# Patient Record
Sex: Male | Born: 2000 | Race: White | Hispanic: No | Marital: Single | Smoking: Never smoker
Health system: Southern US, Community
[De-identification: ages and names within clinical notes are randomized; demographics above are authoritative.]

## PROBLEM LIST (undated history)

## (undated) HISTORY — PX: TONSILLECTOMY: SUR1361

---
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
## 2021-04-28 DIAGNOSIS — S76011A Strain of muscle, fascia and tendon of right hip, initial encounter: Secondary | ICD-10-CM | POA: Diagnosis not present

## 2021-06-02 DIAGNOSIS — M546 Pain in thoracic spine: Secondary | ICD-10-CM | POA: Diagnosis not present

## 2021-06-02 DIAGNOSIS — M62830 Muscle spasm of back: Secondary | ICD-10-CM | POA: Diagnosis not present

## 2021-06-26 DIAGNOSIS — Z23 Encounter for immunization: Secondary | ICD-10-CM | POA: Diagnosis not present

## 2021-06-26 DIAGNOSIS — Z0000 Encounter for general adult medical examination without abnormal findings: Secondary | ICD-10-CM | POA: Diagnosis not present

## 2021-10-30 ENCOUNTER — Other Ambulatory Visit: Payer: Self-pay

## 2021-10-30 ENCOUNTER — Encounter (HOSPITAL_COMMUNITY): Payer: Self-pay | Admitting: Emergency Medicine

## 2021-10-30 ENCOUNTER — Emergency Department (HOSPITAL_COMMUNITY)
Admission: EM | Admit: 2021-10-30 | Discharge: 2021-10-31 | Disposition: A | Discharge status: Home / Self Care | Payer: BC Managed Care – PPO | Attending: Student | Admitting: Student

## 2021-10-30 DIAGNOSIS — N451 Epididymitis: Secondary | ICD-10-CM | POA: Diagnosis not present

## 2021-10-30 DIAGNOSIS — N433 Hydrocele, unspecified: Secondary | ICD-10-CM | POA: Diagnosis not present

## 2021-10-30 DIAGNOSIS — N50812 Left testicular pain: Secondary | ICD-10-CM | POA: Diagnosis not present

## 2021-10-30 DIAGNOSIS — N503 Cyst of epididymis: Secondary | ICD-10-CM | POA: Diagnosis not present

## 2021-10-30 NOTE — ED Provider Triage Note (Signed)
Emergency Medicine Provider Triage Evaluation Note ? ?James [name] , a 21 y.o. male  was evaluated in triage.  Pt complains of left testicular pain onset this morning. Has not tried medications for symptoms.  Denies dysuria, hematuria, penile pain/swelling, testicular swelling, lesions.  Denies concern for STI at this time. ? ?Review of Systems  ?Positive: As per HPI above ?Negative: As per HPI above ? ?Physical Exam  ?BP 124/65   Pulse 80   Temp 99.2 ?F (37.3 ?C) (Oral)   Resp 16   Ht 5\' 8"  (1.727 m)   Wt 62 kg   SpO2 100%   BMI 20.78 kg/m?  ?Gen:   Awake, no distress   ?Resp:  Normal effort  ?MSK:   Moves extremities without difficulty  ?Other:  RN chaperone present for exam.  No lesions noted to genital area.  Testicles within normal lie. Cremasteric reflex intact bilaterally.  ? ?Medical Decision Making  ?Medically screening exam initiated at 11:42 PM.  Appropriate orders placed.  Chidiebere Wynn was informed that the remainder of the evaluation will be completed by another provider, this initial triage assessment does not replace that evaluation, and the importance of remaining in the ED until their evaluation is complete. ?  ?Anissa Abbs A, PA-C ?10/31/21 0000 ? ?

## 2021-10-30 NOTE — ED Triage Notes (Signed)
Patient reports left testicular pain onset this morning , denies injury , no hematuria or dysuria .  ?

## 2021-10-31 ENCOUNTER — Emergency Department (HOSPITAL_COMMUNITY): Payer: BC Managed Care – PPO

## 2021-10-31 DIAGNOSIS — N433 Hydrocele, unspecified: Secondary | ICD-10-CM | POA: Diagnosis not present

## 2021-10-31 DIAGNOSIS — N503 Cyst of epididymis: Secondary | ICD-10-CM | POA: Diagnosis not present

## 2021-10-31 LAB — CBC WITH DIFFERENTIAL/PLATELET
Abs Immature Granulocytes: 0.03 10*3/uL (ref 0.00–0.07)
Basophils Absolute: 0.1 10*3/uL (ref 0.0–0.1)
Basophils Relative: 1 %
Eosinophils Absolute: 0.1 10*3/uL (ref 0.0–0.5)
Eosinophils Relative: 2 %
HCT: 43.1 % (ref 39.0–52.0)
Hemoglobin: 15.3 g/dL (ref 13.0–17.0)
Immature Granulocytes: 1 %
Lymphocytes Relative: 36 %
Lymphs Abs: 2.2 10*3/uL (ref 0.7–4.0)
MCH: 32.6 pg (ref 26.0–34.0)
MCHC: 35.5 g/dL (ref 30.0–36.0)
MCV: 91.9 fL (ref 80.0–100.0)
Monocytes Absolute: 0.6 10*3/uL (ref 0.1–1.0)
Monocytes Relative: 9 %
Neutro Abs: 3.1 10*3/uL (ref 1.7–7.7)
Neutrophils Relative %: 51 %
Platelets: 193 10*3/uL (ref 150–400)
RBC: 4.69 MIL/uL (ref 4.22–5.81)
RDW: 11.9 % (ref 11.5–15.5)
WBC: 6.1 10*3/uL (ref 4.0–10.5)
nRBC: 0 % (ref 0.0–0.2)

## 2021-10-31 LAB — BASIC METABOLIC PANEL
Anion gap: 8 (ref 5–15)
BUN: 16 mg/dL (ref 6–20)
CO2: 24 mmol/L (ref 22–32)
Calcium: 10.2 mg/dL (ref 8.9–10.3)
Chloride: 106 mmol/L (ref 98–111)
Creatinine, Ser: 0.83 mg/dL (ref 0.61–1.24)
GFR, Estimated: >60 mL/min (ref >60)
Glucose, Bld: 96 mg/dL (ref 70–99)
Potassium: 3.9 mmol/L (ref 3.5–5.1)
Sodium: 138 mmol/L (ref 135–145)

## 2021-10-31 LAB — URINALYSIS, ROUTINE W REFLEX MICROSCOPIC
Bilirubin Urine: NEGATIVE
Glucose, UA: NEGATIVE mg/dL
Hgb urine dipstick: NEGATIVE
Ketones, ur: NEGATIVE mg/dL
Leukocytes,Ua: NEGATIVE
Nitrite: NEGATIVE
Protein, ur: NEGATIVE mg/dL
Specific Gravity, Urine: 1.002 — ABNORMAL LOW (ref 1.005–1.030)
pH: 6 (ref 5.0–8.0)

## 2021-10-31 MED ORDER — NAPROXEN 375 MG PO TABS: 375.0000 mg | ORAL_TABLET | Freq: Two times a day (BID) | ORAL | 0 refills | Status: DC

## 2021-10-31 NOTE — ED Provider Notes (Signed)
?[facility] EMERGENCY DEPARTMENT ?Provider Note ? ?CSN: [id number] ?Arrival date & time: 10/30/21 2238 ? ?Chief Complaint(s) ?Testicular Pain ( Left)  ? ?HPI ?James [name] is a 21 y.o. male who presents emergency department for evaluation of testicular pain.  Patient states that he had gradual onset left-sided testicular pain that started this morning.  Denies dysuria, hematuria, penile discharge, penile rash, high risk behavior.  Denies chest pain, shortness of breath, abdominal pain, nausea, vomiting or other systemic symptoms.  He states the symptoms peaked causing him to come to the emergency department, but since he has been waiting for 5 and half hours in the ER waiting room, his symptoms have since resolved. ? ?HPI ? ?Past Medical History ?History reviewed. No pertinent past medical history. ?There are no problems to display for this patient. ? ?Home Medication(s) ?Prior to Admission medications   ?Medication Sig Start Date End Date Taking? Authorizing Provider  ?naproxen (NAPROSYN) 375 MG tablet Take 1 tablet (375 mg total) by mouth 2 (two) times daily. 10/31/21  Yes Serai Tukes, Wyn Forster, MD  ?                                                                                                                                  ?Past Surgical History ?Past Surgical History:  ?Procedure Laterality Date  ? TONSILLECTOMY    ? ?Family History ?No family history on file. ? ?Social History ?Social History  ? ?Tobacco Use  ? Smoking status: Never  ?Substance Use Topics  ? Alcohol use: Yes  ? Drug use: Yes  ?  Types: Marijuana  ? ?Allergies ?Patient has no known allergies. ? ?Review of Systems ?Review of Systems  ?Genitourinary:  Positive for testicular pain.  ? ?Physical Exam ?Vital Signs  ?I have reviewed the triage vital signs ?BP 107/63   Pulse 94   Temp 99.2 ?F (37.3 ?C) (Oral)   Resp 16   Ht 5\' 8"  (1.727 m)   Wt 62 kg   SpO2 100%   BMI 20.78 kg/m?  ? ?Physical Exam ?Vitals and nursing note  reviewed.  ?Constitutional:   ?   General: He is not in acute distress. ?   Appearance: He is well-developed.  ?HENT:  ?   Head: Normocephalic and atraumatic.  ?Eyes:  ?   Conjunctiva/sclera: Conjunctivae normal.  ?Cardiovascular:  ?   Rate and Rhythm: Normal rate and regular rhythm.  ?   Heart sounds: No murmur heard. ?Pulmonary:  ?   Effort: Pulmonary effort is normal. No respiratory distress.  ?   Breath sounds: Normal breath sounds.  ?Abdominal:  ?   Palpations: Abdomen is soft.  ?   Tenderness: There is no abdominal tenderness.  ?Genitourinary: ?   Penis: Normal.   ?   Testes: Normal.  ?Musculoskeletal:     ?   General: No swelling.  ?   Cervical back: Neck supple.  ?Skin: ?  General: Skin is warm and dry.  ?   Capillary Refill: Capillary refill takes less than 2 seconds.  ?Neurological:  ?   Mental Status: He is alert.  ?Psychiatric:     ?   Mood and Affect: Mood normal.  ? ? ?ED Results and Treatments ?Labs ?(all labs ordered are listed, but only abnormal results are displayed) ?Labs Reviewed  ?URINALYSIS, ROUTINE W REFLEX MICROSCOPIC - Abnormal; Notable for the following components:  ?    Result Value  ? Color, Urine COLORLESS (*)   ? Specific Gravity, Urine 1.002 (*)   ? All other components within normal limits  ?BASIC METABOLIC PANEL  ?CBC WITH DIFFERENTIAL/PLATELET  ?                                                                                                                       ? ?Radiology ?US SCROTUM W/DOPPLER ? ?Result Date: 10/31/2021 ?CLINICAL DATA:  Testicular pain. EXAM: SCROTAL ULTRASOUND DOPPLER ULTRASOUND OF THE TESTICLES TECHNIQUE: Complete ultrasound examination of the testicles, epididymis, and other scrotal structures was performed. Color and spectral Doppler ultrasound were also utilized to evaluate blood flow to the testicles. COMPARISON:  None. FINDINGS: Right testicle Measurements: 5.1 cm x 2.4 cm x 3.3 cm. No mass is visualized. A 0.3 cm x 0.2 cm x 0.3 cm RIGHT scrotal pearl is  seen. Left testicle Measurements: 4.6 cm x 2.3 cm x 3.5 cm. No mass is visualized. A 0.5 cm x 0.2 cm x 0.4 cm LEFT scrotal pearl is seen. Right epididymis: A 0.6 cm x 0.5 cm x 0.5 cm right epididymal head cyst is seen. Left epididymis:  Normal in size and appearance. Hydrocele: Small bilateral hydroceles are seen, left greater than right. Varicocele:  None visualized. Pulsed Doppler interrogation of both testes demonstrates normal low resistance arterial and venous waveforms bilaterally. IMPRESSION: 1. Small, bilateral scrotal pearls. 2. Small bilateral hydroceles, left greater than right. 3. Subcentimeter right epididymal head cyst. Electronically Signed   By: Aram Candela M.D.   On: 10/31/2021 00:46   ? ?Pertinent labs & imaging results that were available during my care of the patient were reviewed by me and considered in my medical decision making (see MDM for details). ? ?Medications Ordered in ED ?Medications - No data to display                                                               ?                                                                    ?  Procedures ?Procedures ? ?(including critical care time) ? ?Medical Decision Making / ED Course ? ? ?This patient presents to the ED for concern of testicular pain, this involves an extensive number of treatment options, and is a complaint that carries with it a high risk of complications and morbidity.  The differential diagnosis includes epididymitis, orchitis, testicular torsion, cystitis ? ?MDM: ?Patient seen the emergency department for evaluation of testicular pain.  Physical exam unremarkable with no tender tenderness to testicular elevation, cremasteric reflex intact.  Ultrasound with small bilateral hydroceles, epididymal cysts, bilateral scrotal pearls.  UA unremarkable, laboratory evaluation unremarkable.  Patient presentation consistent with epididymitis and he was discharged with a prescription for Naprosyn and resources to contact  urologist if symptoms return. ? ? ?Additional history obtained: ?-Additional history obtained from father ?-External records from outside source obtained and reviewed including: Chart review including previous notes, labs, imaging, consultation notes ? ? ?Lab Tests: ?-I ordered, reviewed, and interpreted labs.   ?The pertinent results include:   ?Labs Reviewed  ?URINALYSIS, ROUTINE W REFLEX MICROSCOPIC - Abnormal; Notable for the following components:  ?    Result Value  ? Color, Urine COLORLESS (*)   ? Specific Gravity, Urine 1.002 (*)   ? All other components within normal limits  ?BASIC METABOLIC PANEL  ?CBC WITH DIFFERENTIAL/PLATELET  ?  ? ? ?Imaging Studies ordered: ?I ordered imaging studies including ultrasound scrotum ?I independently visualized and interpreted imaging. ?I agree with the radiologist interpretation ? ? ?Medicines ordered and prescription drug management: ?Meds ordered this encounter  ?Medications  ? naproxen (NAPROSYN) 375 MG tablet  ?  Sig: Take 1 tablet (375 mg total) by mouth 2 (two) times daily.  ?  Dispense:  20 tablet  ?  Refill:  0  ?  ?-I have reviewed the patients home medicines and have made adjustments as needed ? ?Critical interventions ?none ? ?Social Determinants of Health:  ?Factors impacting patients care include: none ? ? ?Reevaluation: ?After the interventions noted above, I reevaluated the patient and found that they have :improved ? ?Co morbidities that complicate the patient evaluation ?History reviewed. No pertinent past medical history.  ? ? ?Dispostion: ?I considered admission for this patient, but as his symptoms resolved and his ultrasound is unremarkable from a torsion standpoint he is safe for discharge ? ? ? ? ?Final Clinical Impression(s) / ED Diagnoses ?Final diagnoses:  ?Epididymitis  ? ? ? ?@PCDICTATION @ ? ?  ? , MD ?10/31/21 12/31/21 ? ?

## 2022-02-18 DIAGNOSIS — H6992 Unspecified Eustachian tube disorder, left ear: Secondary | ICD-10-CM | POA: Diagnosis not present

## 2022-08-18 IMAGING — US US SCROTUM W/ DOPPLER COMPLETE
1 series · 14 of 25 positions shown · non-contrast
Comparison: None.

CLINICAL DATA: Testicular pain.

EXAM:
SCROTAL ULTRASOUND
DOPPLER ULTRASOUND OF THE TESTICLES
TECHNIQUE: Complete ultrasound examination of the testicles, epididymis, and
other scrotal structures was performed. Color and spectral Doppler
ultrasound were also utilized to evaluate blood flow to the
testicles.

[Series 1: us scrotum w/doppler · 14 of 53 slices shown]
[im 1/53]
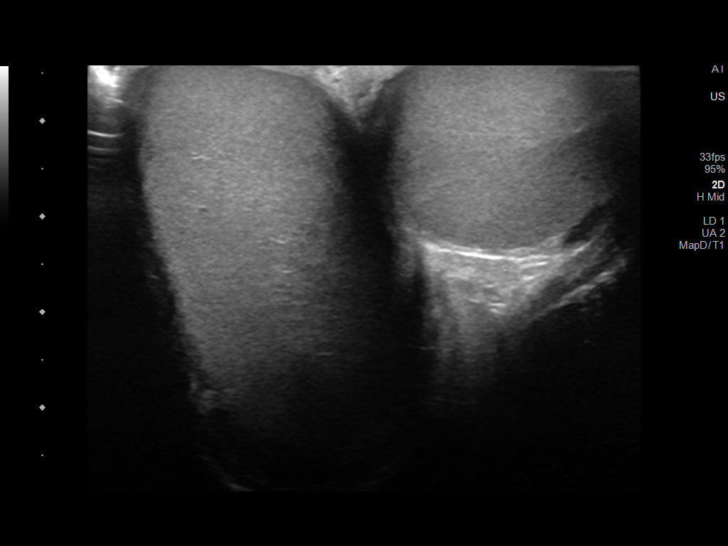
[im 5/53]
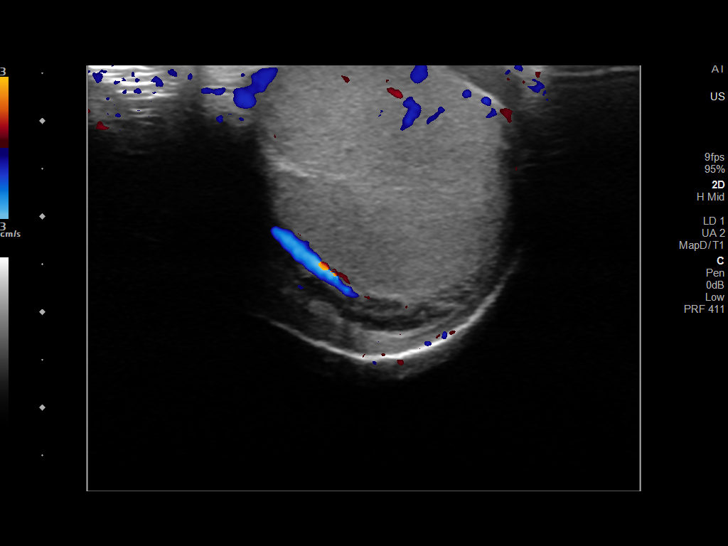
[im 9/53]
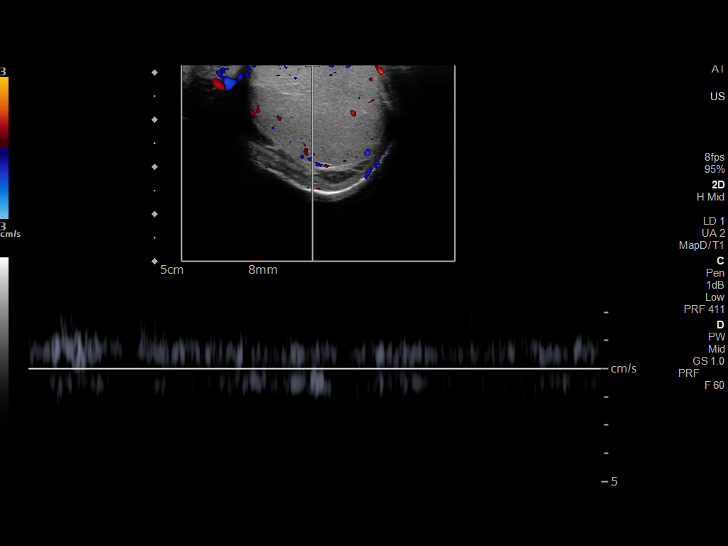
[im 14/53]
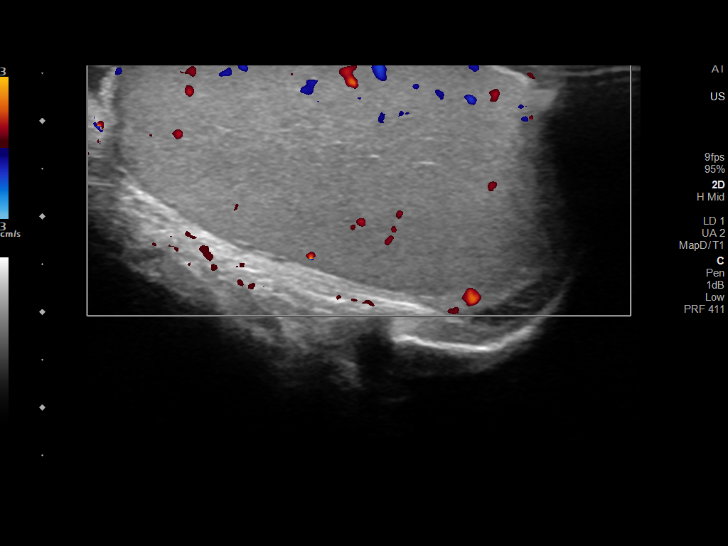
[im 18/53]
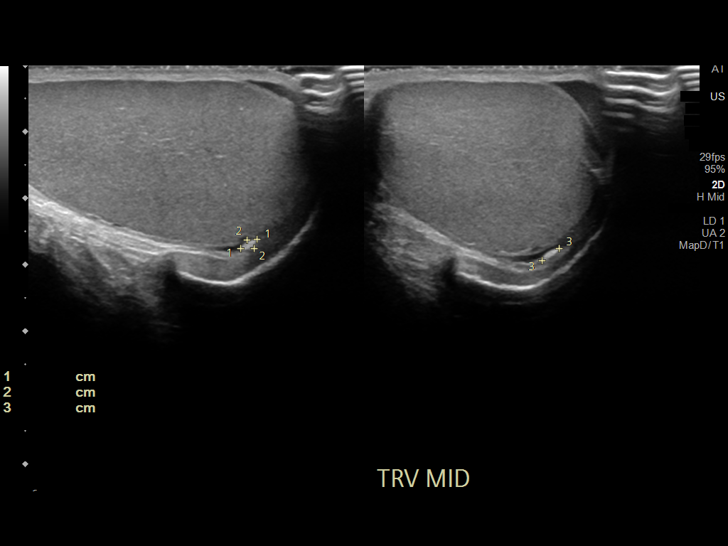
[im 20/53]
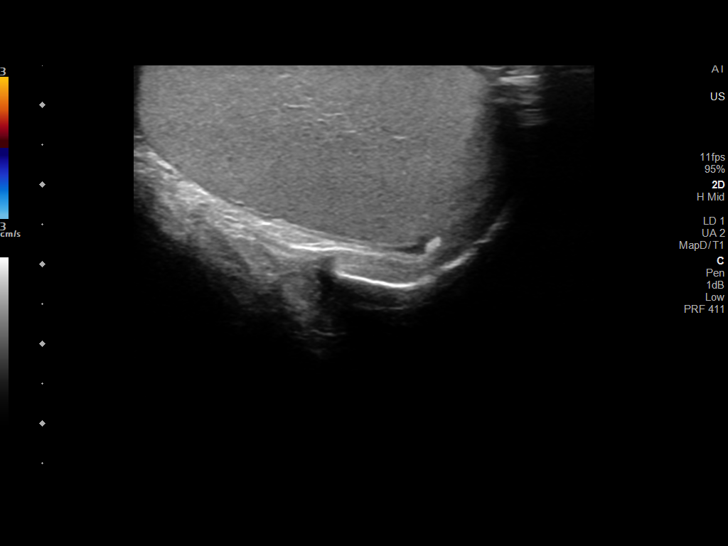
[im 24/53]
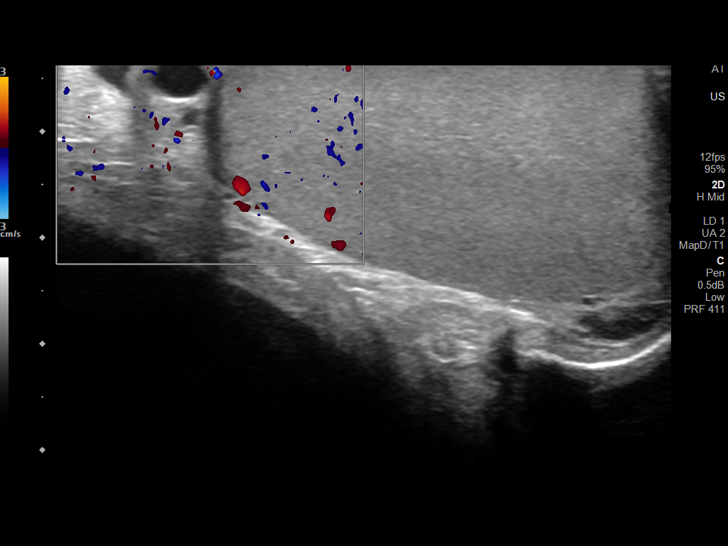
[im 29/53]
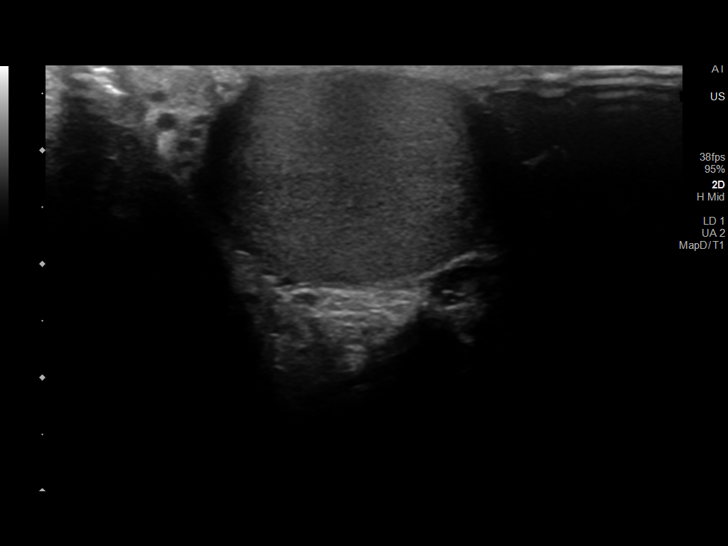
[im 33/53]
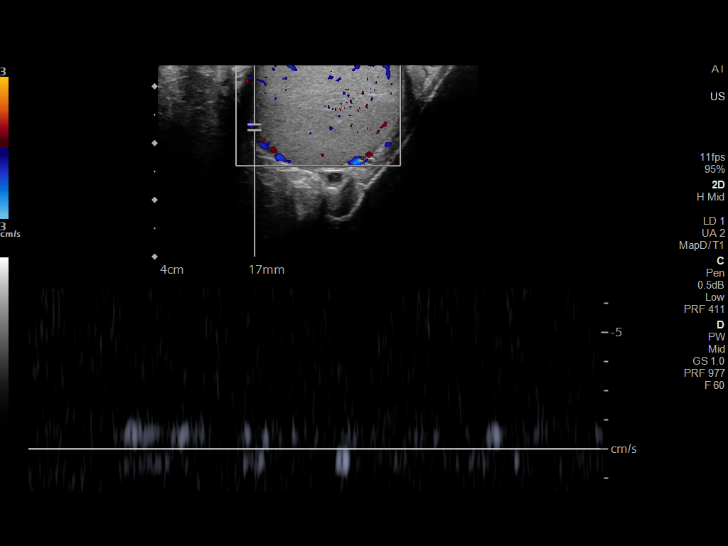
[im 35/53]
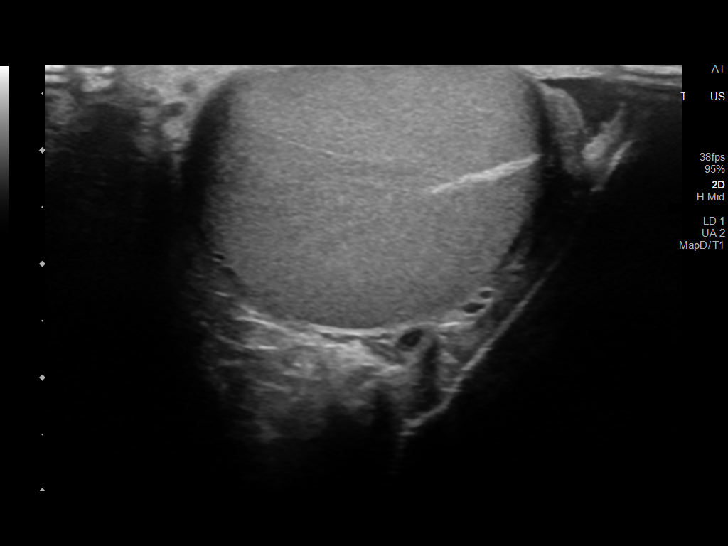
[im 40/53]
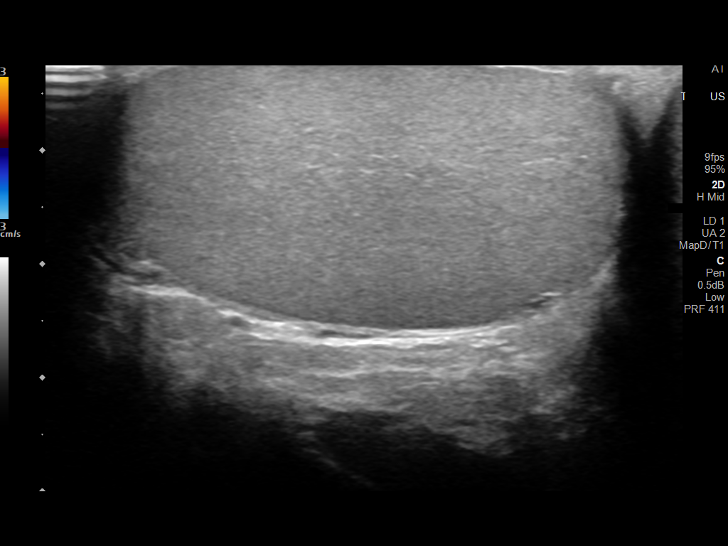
[im 44/53]
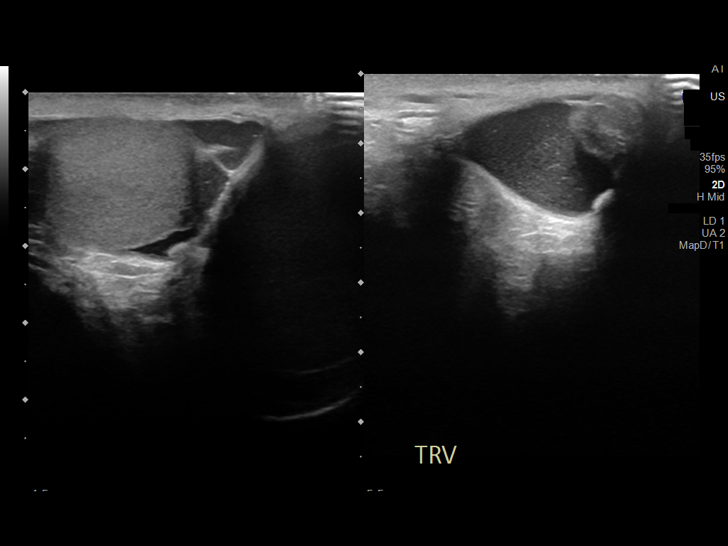
[im 48/53]
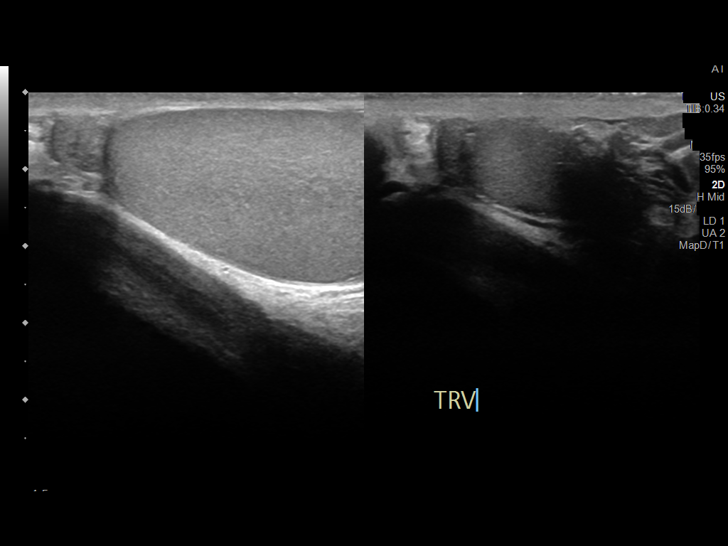
[im 53/53]
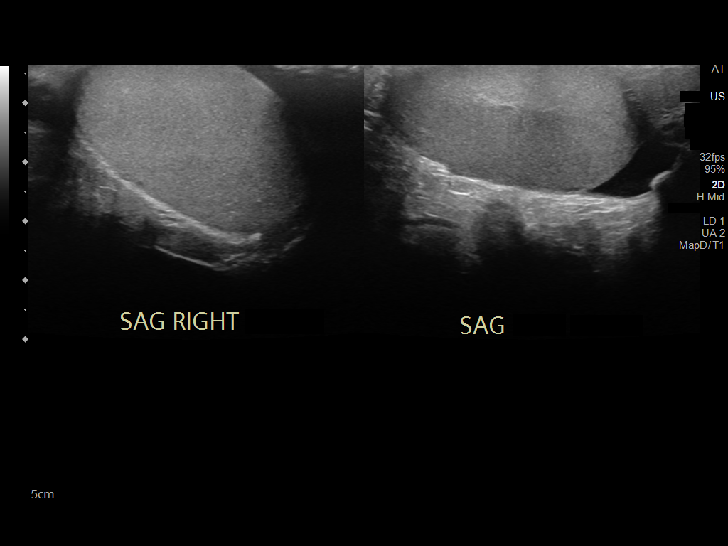

[14 of 25 positions shown; findings below may reference images not displayed]

FINDINGS: Right testicle

Measurements: 5.1 cm x 2.4 cm x 3.3 cm. No mass is visualized. A
cm x 0.2 cm x 0.3 cm RIGHT scrotal pearl is seen.

Left testicle

Measurements: 4.6 cm x 2.3 cm x 3.5 cm. No mass is visualized. A
cm x 0.2 cm x 0.4 cm LEFT scrotal pearl is seen.

Right epididymis: A 0.6 cm x 0.5 cm x 0.5 cm right epididymal head
cyst is seen.

Left epididymis:  Normal in size and appearance.

Hydrocele: Small bilateral hydroceles are seen, left greater than
right.

Varicocele:  None visualized.

Pulsed Doppler interrogation of both testes demonstrates normal low
resistance arterial and venous waveforms bilaterally.
IMPRESSION: 1. Small, bilateral scrotal pearls.
2. Small bilateral hydroceles, left greater than right.
3. Subcentimeter right epididymal head cyst.

## 2022-08-26 DIAGNOSIS — U071 COVID-19: Secondary | ICD-10-CM | POA: Diagnosis not present

## 2022-10-16 DIAGNOSIS — J209 Acute bronchitis, unspecified: Secondary | ICD-10-CM | POA: Diagnosis not present

## 2022-10-16 DIAGNOSIS — J019 Acute sinusitis, unspecified: Secondary | ICD-10-CM | POA: Diagnosis not present

## 2023-06-14 DIAGNOSIS — L308 Other specified dermatitis: Secondary | ICD-10-CM | POA: Diagnosis not present

## 2023-07-29 DIAGNOSIS — R000 Tachycardia, unspecified: Secondary | ICD-10-CM | POA: Diagnosis not present

## 2023-10-11 DIAGNOSIS — J029 Acute pharyngitis, unspecified: Secondary | ICD-10-CM | POA: Diagnosis not present

## 2023-10-25 DIAGNOSIS — R051 Acute cough: Secondary | ICD-10-CM | POA: Diagnosis not present

## 2024-01-15 DIAGNOSIS — B9689 Other specified bacterial agents as the cause of diseases classified elsewhere: Secondary | ICD-10-CM | POA: Diagnosis not present

## 2024-01-15 DIAGNOSIS — R0981 Nasal congestion: Secondary | ICD-10-CM | POA: Diagnosis not present

## 2024-01-15 DIAGNOSIS — J208 Acute bronchitis due to other specified organisms: Secondary | ICD-10-CM | POA: Diagnosis not present

## 2024-01-15 NOTE — Progress Notes (Signed)
 Patient ID:  James [name] 23 year old male presents to the clinic for gradual onset of chest congestion productive cough that is worsened over the last week.  He has not had fever or chills.  He denies any difficulty breathing.  He states he has had some fatigue and malaise as well as flulike symptoms.    Review of Systems  All other systems reviewed and are negative.  There is no problem list on file for this patient.  Current Outpatient Medications  Medication Sig Dispense Refill  . azithromycin (ZITHROMAX) 250 mg tablet Take 1 tablet (250 mg total) by mouth daily for 6 days. Take 2 tablets the first day, then 1 tablet daily for 4 days. 6 tablet 0  . promethazine-dextromethorphan (PHENERGAN DM) 6.25-15 mg/5 mL syrp syrup Take 5 mL by mouth every 6 (six) hours as needed (COugh and/or congestion). 120 mL 0   No current facility-administered medications for this visit.   Allergies:Patient has no known allergies. OBJECTIVE: BP 131/82 (BP Location: Left arm, Patient Position: Sitting)   Pulse 94   Temp 98.4 F (36.9 C) (Oral)   Resp 18   Ht 1.727 m (5' 8)   Wt 59.4 kg (130 lb 15.3 oz)   SpO2 98%   BMI 19.91 kg/m  Physical Exam Vitals and nursing note reviewed.  Constitutional:      Appearance: Normal appearance.  HENT:     Head: Normocephalic and atraumatic.     Right Ear: External ear normal.     Left Ear: External ear normal.     Nose: Nose normal.     Mouth/Throat:     Mouth: Mucous membranes are moist.     Pharynx: Oropharynx is clear.  Eyes:     Pupils: Pupils are equal, round, and reactive to light.  Cardiovascular:     Rate and Rhythm: Normal rate and regular rhythm.     Pulses: Normal pulses.     Heart sounds: Normal heart sounds.  Pulmonary:     Effort: Pulmonary effort is normal. No respiratory distress.     Breath sounds: Rales present.  Abdominal:     General: Abdomen is flat.     Palpations: Abdomen is soft.  Musculoskeletal:        General: Normal range  of motion.     Cervical back: Normal range of motion and neck supple.  Skin:    General: Skin is warm and dry.  Neurological:     General: No focal deficit present.     Mental Status: He is alert and oriented to person, place, and time.  Psychiatric:        Mood and Affect: Mood normal.        Behavior: Behavior normal.    Results:  Recent Results (from the past 24 hours)  POC SARS-COV-2 SYMPTOMATIC (IDNOW)   Collection Time: 01/15/24 12:03 PM  Result Value Ref Range   SARS-COV-2 IDNOW Negative Negative   SARS IDNOW Information      A rapid, molecular diagnostic test on the IDNOW.  Negative results should be treated as presumptive and, if inconsistent with clinical symptoms should be confirmed with an alternative molecular assay.  POC Influenza A&B NAT (IDNOW)   Collection Time: 01/15/24 12:03 PM  Result Value Ref Range   Influenza A RNA Negative Negative   Influenza B RNA Negative Negative   Today's PHQ-2 results:Patient Health Questionnaire-2 Score: 0 (01/15/24 1142) Today's PHQ-9 result:  PHQ-9 Question # 9  Interpretation: PHQ-2 Interpretation: Negative (None-minimal Depression  Severity) (01/15/24 1142)   Depression Plan: Normal/Negative Screening   Medical Decision Making Although likely this is a virus, I was not able to appreciate a good deep breath or expiration as he was not demonstrating even how to cough.  I did hear some rattling in his chest, fact that his cough has become more productive I will give him an antibiotic.  Patient was given cough medicine for his cough which he states is worse at night.  Patient can take over-the-counter Sudafed as needed, as directed.  Amount and/or Complexity of Data Reviewed Labs: ordered. Decision-making details documented in ED Course.  Risk OTC drugs. Prescription drug management.   The likelyhood of other entities in the differential diagnosis is insufficient to justify any symptoms would require further  evaluation Orders: Orders Placed This Encounter  Procedures  . POC SARS-COV-2 SYMPTOMATIC (IDNOW)    Is the patient symptomatic as defined by CDC?:   Yes    Is the patient in ICU?:   No    Is the patient hospitalized?:   No  . POC Influenza A&B NAT (IDNOW)   IMPRESSION: 1. Congestion of nasal sinus   2. Acute bacterial bronchitis    PLAN: Orders Placed This Encounter  Medications  . azithromycin (ZITHROMAX) 250 mg tablet    Sig: Take 1 tablet (250 mg total) by mouth daily for 6 days. Take 2 tablets the first day, then 1 tablet daily for 4 days.    Dispense:  6 tablet    Refill:  0  . promethazine-dextromethorphan (PHENERGAN DM) 6.25-15 mg/5 mL syrp syrup    Sig: Take 5 mL by mouth every 6 (six) hours as needed (COugh and/or congestion).    Dispense:  120 mL    Refill:  0    Return in 1 week (on 01/22/2024), or if symptoms worsen or fail to improve.

## 2024-03-21 NOTE — Progress Notes (Signed)
 Subjective Patient ID: James [name] is a 23 y.o. male.  Chief Complaint: Pain of the Right Foot    HISTORY This is a 23 year old male presents today for evaluation of right foot pain.  Patient complains of right medial ankle pain that has been gradually worsening over the last week.  He states that he is an avid kick boxer and has been having gradual pain worsening with training, especially during kickboxing and pivoting movements.  Denies any numbness or tingling to the right lower extremity.  Denies any injury or trauma.  He has been taking Tylenol with minimal relief.  Pain today is a 5 out of 10 on the VAS.  Objective   PHYSICAL EXAM  The patient is awake, alert, oriented x3.  Breathing is nonlabored.  The patient's mood, affect, coordination, and orientation were unremarkable.  No evident swelling, erythema, or ecchymosis of the right foot or ankle.  Patient does have tenderness over the posterior tibial tendon just posterior to the medial malleolus he has mild pain with resisted plantarflexion and eversion.  Motor, station, pulses are intact distally.  Imaging Results: XR RESULTS: XR foot 3+ views right      DOS: 03/21/2024 X-ray 3 views of the right foot were reviewed inter by me today.  X-rays reveal no evidence of acute fracture, dislocation, or other abnormalities.     Assessment Right medial ankle pain likely secondary to posterior tibial tendinitis likely to overuse from repetitive boxing  Encounter Diagnoses: Right foot pain  Plan The findings were reviewed with the patient and the options for treatment.  At this time we discussed treating this conservatively.  I recommended rest from high-impact kickboxing for at least a week or 2.  I have sent a prescription for Mobic 15 Mg for which the patient will take for the next 2 to 3 weeks to help and calm this down.  He may use RICE protocol as needed.  He can use ankle brace or arch support for additional stabilization.  He  will keep us  apprised of his progress and we will follow-up with our foot and ankle team if he continues to have symptoms after 4 weeks.  This note has been created with voice recognition software, and while it has been edited for accuracy, the software periodically misinterpreted speech.  This may result in errors that might not have been caught in editing.  In the event that you find an unusual error in this record, please notify us  at [phone] to resolve the issue.  Orders Placed This Encounter  . XR foot 3+ views right

## 2024-04-11 DIAGNOSIS — Z23 Encounter for immunization: Secondary | ICD-10-CM | POA: Diagnosis not present

## 2024-04-11 DIAGNOSIS — Z0000 Encounter for general adult medical examination without abnormal findings: Secondary | ICD-10-CM | POA: Diagnosis not present

## 2024-05-28 NOTE — Progress Notes (Unsigned)
 Cardiology Clinic Note   Patient Name: James [name] Date of Encounter: 05/29/2024  Primary Care Provider:  [facility] Primary Cardiologist:  None  Patient Profile    James [name] 23 year old male presents to the clinic today for an evaluation of his palpitations and to establish care.  Past Medical History    History reviewed. No pertinent past medical history. Past Surgical History:  Procedure Laterality Date   TONSILLECTOMY      Allergies  No Known Allergies  History of Present Illness    Gid Schoffstall has a PMH of tonsillectomy and wisdom teeth extraction.  He was seen by his PCP on 04/11/2024.  He presented for his annual physical exam and tetanus booster.  He has no prior cardiac history.    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  He exercises 4 times per week.  He presents to the clinic today for evaluation of palpitations.  He denies history of tobacco use and notes that he will occasionally drink socially.  He states he is physically active doing kickboxing several days per week.  He also does resistance training.  He notes that on his exercise days he increases his hydration.  He does note that last Friday he was dizzy and attributes this to dehydration.  He notes that during his episodes he has some tightness in his chest.  He also reports pinching type feelings that can last for 30 minutes and dissipate with rest.  We reviewed triggers for palpitations.  He expressed understanding.  He presents with his dad today.  His dad notes that his brother also had similar symptoms.  He notes that on a normal day he drinks 4-5 bottles of water.  He notes that his diet is fairly balanced but he does eat fast food while he is at school.  Initially his blood pressure is 154/68 and on recheck is 138/58.  I will order 7-day cardiac event monitor, CBC, BMP, TSH, magnesium, and have him avoid triggers for palpitations.  Today he  denies chest pain, shortness of breath, lower extremity edema, fatigue,  melena, hematuria, hemoptysis, diaphoresis, weakness, presyncope, syncope, orthopnea, and PND.    Home Medications    Prior to Admission medications   Medication Sig Start Date End Date Taking? Authorizing Provider  naproxen  (NAPROSYN ) 375 MG tablet Take 1 tablet (375 mg total) by mouth 2 (two) times daily. 10/31/21   Kommor, Lum, MD    Family History    Family History  Problem Relation Age of Onset   Hypertension Paternal Grandmother    Heart attack Paternal Grandfather    Heart attack Maternal Great-grandfather    He indicated that his mother is alive. He indicated that his father is alive. He indicated that his maternal grandmother is deceased. He indicated that his maternal grandfather is deceased. He indicated that his paternal grandmother is alive. He indicated that his paternal grandfather is deceased. He indicated that his maternal great-grandfather is deceased.  Social History    Social History   Socioeconomic History   Marital status: Single    Spouse name: Not on file   Number of children: Not on file   Years of education: Not on file   Highest education level: Not on file  Occupational History   Not on file  Tobacco Use   Smoking status: Never   Smokeless tobacco: Never  Vaping Use   Vaping status: Never Used  Substance and Sexual Activity  Alcohol use: Yes    Comment: occasionally   Drug use: Not Currently    Types: Marijuana   Sexual activity: Not on file  Other Topics Concern   Not on file  Social History Narrative   Not on file   Social Drivers of Health   Financial Resource Strain: Not on file  Food Insecurity: Not on file  Transportation Needs: Not on file  Physical Activity: Not on file  Stress: Not on file  Social Connections: Not on file  Intimate Partner Violence: Not on file     Review of Systems    General:  No chills, fever, night sweats or weight changes.   Cardiovascular:  No chest pain, dyspnea on exertion, edema, orthopnea, palpitations, paroxysmal nocturnal dyspnea. Dermatological: No rash, lesions/masses Respiratory: No cough, dyspnea Urologic: No hematuria, dysuria Abdominal:   No nausea, vomiting, diarrhea, bright red blood per rectum, melena, or hematemesis Neurologic:  No visual changes, wkns, changes in mental status. All other systems reviewed and are otherwise negative except as noted above.  Physical Exam    VS:  BP (!) 138/58   Pulse (!) 104   Resp 18   Ht 5' 8 (1.727 m)   Wt 131 lb 12.8 oz (59.8 kg)   SpO2 99%   BMI 20.04 kg/m  , BMI Body mass index is 20.04 kg/m. GEN: Well nourished, well developed, in no acute distress. HEENT: normal. Neck: Supple, no JVD, carotid bruits, or masses. Cardiac: RRR, no murmurs, rubs, or gallops. No clubbing, cyanosis, edema.  Radials/DP/PT 2+ and equal bilaterally.  Respiratory:  Respirations regular and unlabored, clear to auscultation bilaterally. GI: Soft, nontender, nondistended, BS + x 4. MS: no deformity or atrophy. Skin: warm and dry, no rash. Neuro:  Strength and sensation are intact. Psych: Normal affect.  Accessory Clinical Findings    Recent Labs: No results found for requested labs within last 365 days.   Recent Lipid Panel No results found for: CHOL, TRIG, HDL, CHOLHDL, VLDL, LDLCALC, LDLDIRECT       ECG personally reviewed by me today- EKG Interpretation Date/Time:  Tuesday May 29 2024 14:04:41 EDT Ventricular Rate:  83 PR Interval:  128 QRS Duration:  96 QT Interval:  358 QTC Calculation: 420 R Axis:   74  Text Interpretation: Sinus rhythm with marked sinus arrhythmia RSR' or QR pattern in V1 suggests right ventricular conduction delay No previous ECGs available Confirmed by Emelia Hazy [phone]) on 05/29/2024 2:19:44 PM         Assessment & Plan   1.  Palpitations-EKG today shows sinus rhythm with sinus arrhythmia 83 bpm.   We reviewed options for further prognostication.  I described cardiac event monitor and instructions for applying.  He and his dad expressed understanding.   Avoid triggers caffeine, chocolate, EtOH, dehydration excetra. Maintain physical activity Heart healthy low-sodium diet Order cardiac event monitor. Order CBC, BMP, magnesium, thyroid  panel  Disposition: Follow-up with Dr. Lavona or me in 8 weeks.   Hazy HERO. Cecillia Menees NP-C     05/29/2024, 2:37 PM [facility] [address] [place], [place] [address] Office [phone]    Notice: This dictation was prepared with Dragon dictation along with smaller phrase technology. Any transcriptional errors that result from this process are unintentional and may not be corrected upon review.   I spent 14 minutes examining this patient, reviewing medications, and using patient centered shared decision making involving their cardiac care.   I spent  20  minutes reviewing past medical history,  medications, and prior cardiac tests.

## 2024-05-29 ENCOUNTER — Encounter: Payer: Self-pay | Admitting: General Practice

## 2024-05-29 ENCOUNTER — Ambulatory Visit

## 2024-05-29 ENCOUNTER — Ambulatory Visit: Attending: General Practice | Admitting: General Practice

## 2024-05-29 VITALS — BP 138/58 | HR 104 | Resp 18 | Ht 68.0 in | Wt 131.8 lb

## 2024-05-29 DIAGNOSIS — R002 Palpitations: Secondary | ICD-10-CM

## 2024-05-29 NOTE — Patient Instructions (Addendum)
 Medication Instructions:   Your physician recommends that you continue on your current medications as directed. Please refer to the Current Medication list given to you today.   *If you need a refill on your cardiac medications before your next appointment, please call your pharmacy*   Lab Work:  PLEASE GO DOWN STAIRS  [facility]  FIRST FLOOR   ( GET OFF ELEVATORS WALK TOWARDS WAITING AREA LAB LOCATED BY PHARMACY):  BMET  CBC  MAG FULL THYROID  PANEL    If you have labs (blood work) drawn today and your tests are completely normal, you will receive your results only by: MyChart Message (if you have MyChart) OR A paper copy in the mail If you have any lab test that is abnormal or we need to change your treatment, we will call you to review the results.   Testing/Procedures:  Your physician has recommended that you wear an event monitor. Event monitors are medical devices that record the heart's electrical activity. Doctors most often us  these monitors to diagnose arrhythmias. Arrhythmias are problems with the speed or rhythm of the heartbeat. The monitor is a small, portable device. You can wear one while you do your normal daily activities. This is usually used to diagnose what is causing palpitations/syncope (passing out).    Follow-Up:  At [facility], you and your health needs are our priority.  As part of our continuing mission to provide you with exceptional heart care, our providers are all part of one team.  This team includes your primary Cardiologist (physician) and Advanced Practice Providers or APPs (Physician Assistants and Nurse Practitioners) who all work together to provide you with the care you need, when you need it.   Your next appointment:   8 week(s)  Provider:  Josefa Beauvais NP      We recommend signing up for the patient portal called MyChart.  Sign up information is provided on this After Visit Summary.  MyChart is used to connect with patients for  Virtual Visits (Telemedicine).  Patients are able to view lab/test results, encounter notes, upcoming appointments, etc.  Non-urgent messages can be sent to your provider as well.   To learn more about what you can do with MyChart, go to [email or web address].   Other Instructions  ZIO AT Long term monitor-Live Telemetry  Your physician has requested you wear a ZIO patch monitor for 7  days.  This is a single patch monitor. [employer] supplies one patch monitor per enrollment. Additional  stickers are not available.  Please do not apply patch if you will be having a Nuclear Stress Test, Echocardiogram, Cardiac CT, MRI,  or Chest Xray during the period you would be wearing the monitor. The patch cannot be worn during  these tests. You cannot remove and re-apply the ZIO AT patch monitor.  Your ZIO patch monitor will be mailed 3 day USPS to your address on file. It may take 3-5 days to  receive your monitor after you have been enrolled.  Once you have received your monitor, please review the enclosed instructions. Your monitor has  already been registered assigning a specific monitor serial # to you.   Billing and Patient Assistance Program information  Meredeth has been supplied with any insurance information on record for billing. [employer] offers a sliding scale Patient Assistance Program for patients without insurance, or whose  insurance does not completely cover the cost of the ZIO patch monitor. You must apply for the  Patient Assistance Program to  qualify for the discounted rate. To apply, call [employer] at [phone],  select option 4, select option 2 , ask to apply for the Patient Assistance Program, (you can request an  interpreter if needed). [employer] will ask your household income and how many people are in your  household. [employer] will quote your out-of-pocket cost based on this information. They will also be able  to set up a 12 month interest free payment plan if  needed.  Applying the monitor   Shave hair from upper left chest.  Hold the abrader disc by orange tab. Rub the abrader in 40 strokes over left upper chest as indicated in  your monitor instructions.  Clean area with 4 enclosed alcohol pads. Use all pads to ensure the area is cleaned thoroughly. Let  dry.  Apply patch as indicated in monitor instructions. Patch will be placed under collarbone on left side of  chest with arrow pointing upward.  Rub patch adhesive wings for 2 minutes. Remove the white label marked 1. Remove the white label  marked 2. Rub patch adhesive wings for 2 additional minutes.  While looking in a mirror, press and release button in center of patch. A small green light will flash 3-4  times. This will be your only indicator that the monitor has been turned on.  Do not shower for the first 24 hours. You may shower after the first 24 hours.  Press the button if you feel a symptom. You will hear a small click. Record Date, Time and Symptom in  the Patient Log.   Starting the Gateway  In your kit there is a Audiological scientist box the size of a cellphone. This is Buyer, retail. It transmits all your  recorded data to [facility]. This box must always stay within 10 feet of you. Open the box and push the *  button. There will be a light that blinks orange and then green a few times. When the light stops  blinking, the Gateway is connected to the ZIO patch. Call [employer] at [phone] to confirm your monitor is transmitting.  Returning your monitor  Remove your patch and place it inside the Gateway. In the lower half of the Gateway there is a white  bag with prepaid postage on it. Place Gateway in bag and seal. Mail package back to [place] as soon as  possible. Your physician should have your final report approximately 7 days after you have mailed back  your monitor. Call [facility] Customer Care at [phone] if you have questions regarding your ZIO AT  patch  monitor. Call them immediately if you see an orange light blinking on your monitor.  If your monitor falls off in less than 4 days, contact our Monitor department at [phone]. If your  monitor becomes loose or falls off after 4 days call [employer] at [phone] for suggestions on  securing your monitor

## 2024-05-29 NOTE — Progress Notes (Unsigned)
 Enrolled for [employer] to mail a ZIO XT long term holter monitor to the patients address on file.   DOD to read.

## 2024-05-30 ENCOUNTER — Ambulatory Visit: Payer: Self-pay | Admitting: General Practice

## 2024-05-30 LAB — BASIC METABOLIC PANEL WITH GFR
BUN/Creatinine Ratio: 12 (ref 9–20)
BUN: 9 mg/dL (ref 6–20)
CO2: 22 mmol/L (ref 20–29)
Calcium: 10.5 mg/dL — AB (ref 8.7–10.2)
Chloride: 105 mmol/L (ref 96–106)
Creatinine, Ser: 0.76 mg/dL (ref 0.76–1.27)
Glucose: 75 mg/dL (ref 70–99)
Potassium: 4.4 mmol/L (ref 3.5–5.2)
Sodium: 142 mmol/L (ref 134–144)
eGFR: 130 mL/min/1.73 (ref >59)

## 2024-05-30 LAB — THYROID PANEL WITH TSH
Free Thyroxine Index: 2.1 (ref 1.2–4.9)
T3 Uptake Ratio: 29 % (ref 24–39)
T4, Total: 7.1 ug/dL (ref 4.5–12.0)
TSH: 1.37 u[IU]/mL (ref 0.450–4.500)

## 2024-05-30 LAB — CBC
Hematocrit: 44.2 % (ref 37.5–51.0)
Hemoglobin: 14.7 g/dL (ref 13.0–17.7)
MCH: 32.5 pg (ref 26.6–33.0)
MCHC: 33.3 g/dL (ref 31.5–35.7)
MCV: 98 fL — ABNORMAL HIGH (ref 79–97)
Platelets: 198 x10E3/uL (ref 150–450)
RBC: 4.52 x10E6/uL (ref 4.14–5.80)
RDW: 12.1 % (ref 11.6–15.4)
WBC: 3.6 x10E3/uL (ref 3.4–10.8)

## 2024-05-30 LAB — MAGNESIUM: Magnesium: 2.2 mg/dL (ref 1.6–2.3)

## 2024-06-26 DIAGNOSIS — J029 Acute pharyngitis, unspecified: Secondary | ICD-10-CM | POA: Diagnosis not present

## 2024-06-26 DIAGNOSIS — H6693 Otitis media, unspecified, bilateral: Secondary | ICD-10-CM | POA: Diagnosis not present

## 2024-06-26 DIAGNOSIS — Z03818 Encounter for observation for suspected exposure to other biological agents ruled out: Secondary | ICD-10-CM | POA: Diagnosis not present

## 2024-07-01 DIAGNOSIS — R002 Palpitations: Secondary | ICD-10-CM

## 2024-07-23 NOTE — Progress Notes (Unsigned)
 Cardiology Clinic Note   Patient Name: James [name] Date of Encounter: 07/24/2024  Primary Care Provider:  [facility] Primary Cardiologist:  None  Patient Profile    James [name] 23 year old male presents to the clinic today for an evaluation of his palpitations and to establish care.  Past Medical History    History reviewed. No pertinent past medical history. Past Surgical History:  Procedure Laterality Date   TONSILLECTOMY      Allergies  No Known Allergies  History of Present Illness    James [name] has a PMH of tonsillectomy and wisdom teeth extraction.  James was seen by his PCP on 04/11/2024.  James presented for his annual physical exam and tetanus booster.  James has no prior cardiac history.    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  James exercises 4 times per week.  James presented to the clinic 05/29/24 for evaluation of palpitations.  James denied history of tobacco use and noted that James would occasionally drink socially.  James stated James was physically active doing kickboxing several days per week.  James also did resistance training.  James noted that on his exercise days James increased his hydration.  James did note that the prior Friday James was dizzy and attributed symptoms to dehydration.  James noted that during his episodes James had some tightness in his chest.  James also reported pinching type feelings that could last for 30 minutes and dissipate with rest.  We reviewed triggers for palpitations.  James expressed understanding.  James [name] .  [name] that his brother also had similar symptoms.  James noted that on a normal day James would drink 4-5 bottles of water.  James noted that his diet was fairly balanced but James did eat fast food while James was at school.  Initially his blood pressure was 154/68 and on recheck was 138/58.  I  ordered 7-day cardiac event monitor, CBC, BMP, TSH, magnesium, and had him avoid triggers for  palpitations.  His cardiac event monitor was reassuring.  His lab work on 05/29/2024 showed a normal BMP, CBC, magnesium, and thyroid  panel.  James presents to the clinic today for follow-up evaluation and states James is back for the week for the holidays.  James presents with [name].  We reviewed his cardiac event monitor and his lab work.  I reassured him that his lab work and cardiac event monitor showed reassuring findings.  I will have him continue his physical activity, continue to avoid triggers for palpitations and follow-up as needed.  Today James denies chest pain, shortness of breath, lower extremity edema, fatigue,  melena, hematuria, hemoptysis, diaphoresis, weakness, presyncope, syncope, orthopnea, and PND.    Home Medications    Prior to Admission medications   Medication Sig Start Date End Date Taking? Authorizing Provider  naproxen  (NAPROSYN ) 375 MG tablet Take 1 tablet (375 mg total) by mouth 2 (two) times daily. 10/31/21   Kommor, Lum, MD    Family History    Family History  Problem Relation Age of Onset   Hypertension Paternal Grandmother    Heart attack Paternal Grandfather    Heart attack Maternal Great-grandfather    James indicated that his mother is alive. James indicated that his father is alive. James indicated that his maternal grandmother is deceased. James indicated that his maternal grandfather is deceased. James indicated that his paternal grandmother is alive. James indicated that his paternal  grandfather is deceased. James indicated that his maternal great-grandfather is deceased.  Social History    Social History   Socioeconomic History   Marital status: Single    Spouse name: Not on file   Number of children: Not on file   Years of education: Not on file   Highest education level: Not on file  Occupational History   Not on file  Tobacco Use   Smoking status: Never   Smokeless tobacco: Never  Vaping Use   Vaping status: Never Used  Substance and Sexual Activity    Alcohol use: Yes    Comment: occasionally   Drug use: Not Currently    Types: Marijuana   Sexual activity: Not on file  Other Topics Concern   Not on file  Social History Narrative   Not on file   Social Drivers of Health   Financial Resource Strain: Not on file  Food Insecurity: Not on file  Transportation Needs: Not on file  Physical Activity: Not on file  Stress: Not on file  Social Connections: Not on file  Intimate Partner Violence: Not on file     Review of Systems    General:  No chills, fever, night sweats or weight changes.  Cardiovascular:  No chest pain, dyspnea on exertion, edema, orthopnea, palpitations, paroxysmal nocturnal dyspnea. Dermatological: No rash, lesions/masses Respiratory: No cough, dyspnea Urologic: No hematuria, dysuria Abdominal:   No nausea, vomiting, diarrhea, bright red blood per rectum, melena, or hematemesis Neurologic:  No visual changes, wkns, changes in mental status. All other systems reviewed and are otherwise negative except as noted above.  Physical Exam    VS:  BP 128/60 (BP Location: Right Arm, Patient Position: Sitting, Cuff Size: Normal)   Pulse 100   Ht 5' 8 (1.727 m)   Wt 125 lb (56.7 kg)   SpO2 99%   BMI 19.01 kg/m  , BMI Body mass index is 19.01 kg/m. GEN: Well nourished, well developed, in no acute distress. HEENT: normal. Neck: Supple, no JVD, carotid bruits, or masses. Cardiac: RRR, no murmurs, rubs, or gallops. No clubbing, cyanosis, edema.  Radials/DP/PT 2+ and equal bilaterally.  Respiratory:  Respirations regular and unlabored, clear to auscultation bilaterally. GI: Soft, nontender, nondistended, BS + x 4. MS: no deformity or atrophy. Skin: warm and dry, no rash. Neuro:  Strength and sensation are intact. Psych: Normal affect.  Accessory Clinical Findings    Recent Labs: 05/29/2024: BUN 9; Creatinine, Ser 0.76; Hemoglobin 14.7; Magnesium 2.2; Platelets 198; Potassium 4.4; Sodium 142; TSH 1.370   Recent  Lipid Panel No results found for: CHOL, TRIG, HDL, CHOLHDL, VLDL, LDLCALC, LDLDIRECT       ECG personally reviewed by me today-   none today.  Cardiac event monitor 05/29/2024  Predominant rhythm was normal sinus Very rare atrial and ventricular ectopy No sustained arrhythmias      Assessment & Plan   1.  Palpitations-heart rate today 100 bpm.  Cardiac event monitor reassuring.  Details above.  Lab work reassuring.   Patient reassured Avoid triggers caffeine, chocolate, EtOH, dehydration excetra.-Reviewed Maintain physical activity Heart healthy low-sodium diet-reviewed   Disposition: Follow-up with Dr. Lavona or me in as needed   James [name]. Zaire Levesque NP-C     07/24/2024, 1:34 PM [facility] [address] [place], [place] [address] Office [phone]    Notice: This dictation was prepared with Dragon dictation along with smaller phrase technology. Any transcriptional errors that result from this process  are unintentional and may not be corrected upon review.   I spent 14 minutes examining this patient, reviewing medications, and using patient centered shared decision making involving their cardiac care.   I spent  20 minutes reviewing past medical history,  medications, and prior cardiac tests.

## 2024-07-24 ENCOUNTER — Encounter: Payer: Self-pay | Admitting: General Practice

## 2024-07-24 ENCOUNTER — Ambulatory Visit: Attending: General Practice | Admitting: General Practice

## 2024-07-24 VITALS — BP 128/60 | HR 100 | Ht 68.0 in | Wt 125.0 lb

## 2024-07-24 DIAGNOSIS — R002 Palpitations: Secondary | ICD-10-CM | POA: Diagnosis not present

## 2024-07-24 NOTE — Patient Instructions (Signed)
 Medication Instructions:  Your physician recommends that you continue on your current medications as directed. Please refer to the Current Medication list given to you today. *If you need a refill on your cardiac medications before your next appointment, please call your pharmacy*  Lab Work: NONE ORDERED If you have labs (blood work) drawn today and your tests are completely normal, you will receive your results only by: MyChart Message (if you have MyChart) OR A paper copy in the mail If you have any lab test that is abnormal or we need to change your treatment, we will call you to review the results.  Testing/Procedures: NONE ORDERED  Follow-Up: At [facility], you and your health needs are our priority.  As part of our continuing mission to provide you with exceptional heart care, our providers are all part of one team.  This team includes your primary Cardiologist (physician) and Advanced Practice Providers or APPs (Physician Assistants and Nurse Practitioners) who all work together to provide you with the care you need, when you need it.  Your next appointment:    FOLLOW UP AS NEEDED  Provider:   ANY APP   We recommend signing up for the patient portal called MyChart.  Sign up information is provided on this After Visit Summary.  MyChart is used to connect with patients for Virtual Visits (Telemedicine).  Patients are able to view lab/test results, encounter notes, upcoming appointments, etc.  Non-urgent messages can be sent to your provider as well.   To learn more about what you can do with MyChart, go to [email or web address].   Other Instructions Exercise regularly as told by your doctor. Make sure to weight daily and keep a weight log.   Moderate-intensity exercise is any activity that gets you moving enough to burn at least three times more energy (calories) than if you were sitting. Examples of moderate exercise include: Walking a mile in 15 minutes. Doing  light yard work. Biking at an easy pace. Most people should get at least 150 minutes of moderate-intensity exercise a week to maintain their body weight.  Increase your water intake: Maintain hydration   How to stay hydrated Drink water regularly: Sip water steadily throughout the day, even before you feel thirsty. A general goal is about 8 cups (64 ounces) per day, but individual needs vary based on age, activity level, and health. Drink with meals: Make it a habit to drink water with every meal. Keep water handy: Carry a reusable water bottle with you to encourage consistent sipping. Hydrate around exercise: Drink water before, during, and after physical activity. Eat hydrating foods: Fruits, vegetables, and soups with broth also contribute to your fluid intake. Monitor your urine: Aim for a light yellow color, like lemonade.   Why hydration is important  Regulates body temperature: Water helps regulate your temperature through sweat production. Lubricates joints: It helps form the synovial fluid that lubricates your joints. Aids digestion: Water is crucial for moistening food and helping digestive enzymes work. Transports nutrients: Blood, which is largely water, transports oxygen and nutrients throughout the body. Boosts physical performance: Adequate hydration can help muscles work efficiently and prevent cramps, dizziness, and fatigue.   When to drink more fluids During hot weather: You need to drink more when it's hot. During exercise: You lose fluids through sweat when you exercise. When you are sick: You may need extra fluids if you are ill. Consult a doctor: If you have a medical condition or take certain medications, your doctor  can give you personalized advice on hydration.   Please try to avoid these triggers: Do not use any products that have nicotine or tobacco in them. These include cigarettes, e-cigarettes, and chewing tobacco. If you need help quitting, ask your  doctor. Eat heart-healthy foods. Talk with your doctor about the right eating plan for you. Exercise regularly as told by your doctor. Stay hydrated Do not drink alcohol, Caffeine or chocolate. Lose weight if you are overweight. Do not use drugs, including cannabis
